# Patient Record
Sex: Male | Born: 1965 | Hispanic: Yes | Marital: Married | Smoking: Current every day smoker
Health system: Southern US, Community
[De-identification: ages and names within clinical notes are randomized; demographics above are authoritative.]

---

## 2005-06-14 ENCOUNTER — Emergency Department: Payer: Self-pay | Admitting: Emergency Medicine

## 2011-06-14 ENCOUNTER — Emergency Department: Payer: Self-pay | Admitting: Emergency Medicine

## 2012-09-11 ENCOUNTER — Emergency Department: Payer: Self-pay | Admitting: Emergency Medicine

## 2014-06-12 ENCOUNTER — Encounter: Payer: Self-pay | Admitting: Emergency Medicine

## 2014-06-12 ENCOUNTER — Emergency Department
Admission: EM | Admit: 2014-06-12 | Discharge: 2014-06-12 | Disposition: A | Discharge status: Home / Self Care | Payer: Self-pay | Attending: Emergency Medicine | Admitting: Emergency Medicine

## 2014-06-12 ENCOUNTER — Emergency Department: Payer: Self-pay

## 2014-06-12 DIAGNOSIS — Y998 Other external cause status: Secondary | ICD-10-CM | POA: Insufficient documentation

## 2014-06-12 DIAGNOSIS — Y92410 Unspecified street and highway as the place of occurrence of the external cause: Secondary | ICD-10-CM | POA: Insufficient documentation

## 2014-06-12 DIAGNOSIS — S99921A Unspecified injury of right foot, initial encounter: Secondary | ICD-10-CM | POA: Insufficient documentation

## 2014-06-12 DIAGNOSIS — S63619A Unspecified sprain of unspecified finger, initial encounter: Secondary | ICD-10-CM

## 2014-06-12 DIAGNOSIS — S63612A Unspecified sprain of right middle finger, initial encounter: Secondary | ICD-10-CM | POA: Insufficient documentation

## 2014-06-12 DIAGNOSIS — Z720 Tobacco use: Secondary | ICD-10-CM | POA: Insufficient documentation

## 2014-06-12 DIAGNOSIS — Y9389 Activity, other specified: Secondary | ICD-10-CM | POA: Insufficient documentation

## 2014-06-12 DIAGNOSIS — Z79899 Other long term (current) drug therapy: Secondary | ICD-10-CM | POA: Insufficient documentation

## 2014-06-12 DIAGNOSIS — L089 Local infection of the skin and subcutaneous tissue, unspecified: Secondary | ICD-10-CM | POA: Insufficient documentation

## 2014-06-12 DIAGNOSIS — B9689 Other specified bacterial agents as the cause of diseases classified elsewhere: Secondary | ICD-10-CM

## 2014-06-12 LAB — BASIC METABOLIC PANEL
Anion gap: 5 (ref 5–15)
BUN: 12 mg/dL (ref 6–20)
CO2: 30 mmol/L (ref 22–32)
Calcium: 8.6 mg/dL — ABNORMAL LOW (ref 8.9–10.3)
Chloride: 105 mmol/L (ref 101–111)
Creatinine, Ser: 0.62 mg/dL (ref 0.61–1.24)
GFR calc Af Amer: >60 mL/min (ref >60)
Glucose, Bld: 87 mg/dL (ref 65–99)
POTASSIUM: 3.6 mmol/L (ref 3.5–5.1)
Sodium: 140 mmol/L (ref 135–145)

## 2014-06-12 LAB — CBC WITH DIFFERENTIAL/PLATELET
BASOS PCT: 0 %
Basophils Absolute: 0 10*3/uL (ref 0–0.1)
Eosinophils Absolute: 0.2 10*3/uL (ref 0–0.7)
Eosinophils Relative: 2 %
HCT: 36.9 % — ABNORMAL LOW (ref 40.0–52.0)
Hemoglobin: 12.5 g/dL — ABNORMAL LOW (ref 13.0–18.0)
LYMPHS PCT: 50 %
Lymphs Abs: 4.6 10*3/uL — ABNORMAL HIGH (ref 1.0–3.6)
MCH: 36.6 pg — ABNORMAL HIGH (ref 26.0–34.0)
MCHC: 34 g/dL (ref 32.0–36.0)
MCV: 107.8 fL — ABNORMAL HIGH (ref 80.0–100.0)
Monocytes Absolute: 0.5 10*3/uL (ref 0.2–1.0)
Monocytes Relative: 5 %
Neutro Abs: 4 10*3/uL (ref 1.4–6.5)
Neutrophils Relative %: 43 %
PLATELETS: 457 10*3/uL — AB (ref 150–440)
RBC: 3.43 MIL/uL — ABNORMAL LOW (ref 4.40–5.90)
RDW: 17.7 % — AB (ref 11.5–14.5)
WBC: 9.3 10*3/uL (ref 3.8–10.6)

## 2014-06-12 MED ORDER — IBUPROFEN 800 MG PO TABS: 800.0000 mg | ORAL_TABLET | Freq: Three times a day (TID) | ORAL | Status: AC | PRN

## 2014-06-12 MED ORDER — TRAMADOL HCL 50 MG PO TABS
ORAL_TABLET | ORAL | Status: AC
  Filled 2014-06-12: qty 1

## 2014-06-12 MED ORDER — SULFAMETHOXAZOLE-TRIMETHOPRIM 800-160 MG PO TABS: 1.0000 | ORAL_TABLET | Freq: Two times a day (BID) | ORAL | Status: DC

## 2014-06-12 MED ORDER — IBUPROFEN 800 MG PO TABS
ORAL_TABLET | ORAL | Status: AC
  Filled 2014-06-12: qty 1

## 2014-06-12 MED ORDER — IBUPROFEN 800 MG PO TABS
800.0000 mg | ORAL_TABLET | Freq: Once | ORAL | Status: AC
  Administered 2014-06-12: 800 mg via ORAL

## 2014-06-12 MED ORDER — SULFAMETHOXAZOLE-TRIMETHOPRIM 800-160 MG PO TABS
1.0000 | ORAL_TABLET | Freq: Once | ORAL | Status: AC
  Administered 2014-06-12: 1 via ORAL

## 2014-06-12 MED ORDER — TRAMADOL HCL 50 MG PO TABS
50.0000 mg | ORAL_TABLET | Freq: Once | ORAL | Status: AC
  Administered 2014-06-12: 50 mg via ORAL

## 2014-06-12 MED ORDER — SULFAMETHOXAZOLE-TRIMETHOPRIM 800-160 MG PO TABS
ORAL_TABLET | ORAL | Status: AC
  Filled 2014-06-12: qty 1

## 2014-06-12 MED ORDER — TRAMADOL HCL 50 MG PO TABS: 50.0000 mg | ORAL_TABLET | Freq: Four times a day (QID) | ORAL | Status: AC | PRN
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
## 2014-06-12 NOTE — Discharge Instructions (Signed)
Esguince de dedo  °(Finger Sprain) ° Un esguince de dedo es un desgarro en uno de los tejidos fuertes y fibrosos (ligamentos) que conectan los huesos en el dedo. La gravedad del esguince depende de la cantidad de ligamento que se rompa. La ruptura puede ser parcial o completa.  °CAUSAS  °A menudo, los esguinces son el resultado de una caída o de un accidente. Si extiende las manos para tomar un objeto o para protegerse, la fuerza del impacto hace que las fibras del ligamento se estiren demasiado. Este exceso de tensión es la causa de que las fibras del ligamento se rompan.  °SÍNTOMAS  °Es posible que pierda el movimiento del dedo. Otros síntomas son:  °· Hematomas °· Sensibilidad. °· Hinchazón. °DIAGNÓSTICO  °Con el fin de diagnosticar el esguince de dedo, el médico examinará el dedo para determinar el grado de desgarro del ligamento. El médico también puede indicar una radiografía del dedo para asegurarse de que no hay huesos rotos.  °TRATAMIENTO  °Si el ligamento está sólo parcialmente roto, el tratamiento generalmente consiste en mantenerlo en una posición fija (immovilización) durante un corto período. Para ello, el médico aplicará un vendaje, yeso, o férula para impedir que el dedo se mueva hasta que se cure. Para un ligamento parcialmente roto, el proceso de curación por lo general demora de 2 a 3 semanas.  °Si el ligamento está completamente roto, podrá necesitar una cirugía para volver a unir el ligamento al hueso. Después de la cirugía le colocarán un yeso o una férula y tendrá que dejar el dedo inmóvil durante 4 a 6 semanas, mientras que el ligamento se cura.  °INSTRUCCIONES PARA EL CUIDADO EN EL HOGAR  °· Mantenga el dedo afectado elevado cuando le sea posible, para disminuir la hinchazón. °· Para aliviar el dolor y la hinchazón, aplique hielo en la articulación dos veces por día, durante 2 a 3 días: °¨ Ponga el hielo en una bolsa plástica. °¨ Colóquese una toalla entre la piel y la bolsa de  hielo. °¨ Deje el hielo en el lugar durante 15 minutos. °· Tome sólo medicamentos de venta libre o recetados para calmar el dolor, según las indicaciones del médico. °· No use anillos en el dedo lesionado. °· No deje su dedo sin protección hasta que el dolor y la rigidez desaparezcan (generalmente entre 3 a 4 semanas). °· No deje que el yeso o la férula se mojen. Cúbralos con una bolsa plástica cuando se dé un baño o una ducha. No debe practicar natación. °· El médico podrá indicarle ejercicios especiales para que haga durante la recuperación para evitar o limitar la rigidez permanente. °SOLICITE ATENCIÓN MÉDICA DE INMEDIATO SI:  °· El yeso o la férula se dañan. °· El dolor empeora en lugar de mejorar. °ASEGÚRESE DE QUE:  °· Comprende estas instrucciones. °· Controlará su enfermedad. °· Solicitará ayuda de inmediato si no mejora o si empeora. °Document Released: 01/05/2005 Document Revised: 03/30/2011 °Exit[employer]® Patient Information ©2015 [employer]. This information is not intended to replace advice given to you by your health care provider. Make sure you discuss any questions you have with your health care provider. ° °
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
## 2018-01-14 ENCOUNTER — Emergency Department
Admission: EM | Admit: 2018-01-14 | Discharge: 2018-01-14 | Disposition: A | Discharge status: Home / Self Care | Payer: Self-pay | Attending: Emergency Medicine | Admitting: Emergency Medicine

## 2018-01-14 ENCOUNTER — Other Ambulatory Visit: Payer: Self-pay

## 2018-01-14 DIAGNOSIS — F17200 Nicotine dependence, unspecified, uncomplicated: Secondary | ICD-10-CM | POA: Insufficient documentation

## 2018-01-14 DIAGNOSIS — B354 Tinea corporis: Secondary | ICD-10-CM | POA: Insufficient documentation

## 2018-01-14 DIAGNOSIS — H1013 Acute atopic conjunctivitis, bilateral: Secondary | ICD-10-CM | POA: Insufficient documentation

## 2018-01-14 MED ORDER — NYSTATIN 100000 UNIT/GM EX POWD: Freq: Four times a day (QID) | CUTANEOUS | 0 refills | Status: AC

## 2018-01-14 MED ORDER — NYSTATIN 100000 UNIT/GM EX CREA: 1.0000 "application " | TOPICAL_CREAM | Freq: Every day | CUTANEOUS | 0 refills | Status: AC

## 2018-01-14 MED ORDER — HYDROXYZINE HCL 50 MG PO TABS: 50.0000 mg | ORAL_TABLET | Freq: Three times a day (TID) | ORAL | 0 refills | Status: AC | PRN

## 2018-01-14 NOTE — ED Notes (Signed)
See triage note  Presents with rash to lower abd and moving into groin and into both thighs  Started about 2 months ago   Positive itching

## 2018-01-14 NOTE — ED Provider Notes (Signed)
Billy Stewart   ____________________________________________   First MD Initiated Contact with Patient 01/14/18 1002     (approximate)  I have reviewed the triage vital signs and the nursing notes.   HISTORY Via interpreter Chief Complaint Rash    HPI Billy Stewart is a 52 y.o. male patient complain of rash on the lower abdomen and perineal area for 2 weeks.  Patient state occasionally he gets the same type of rash in his scalp.  Patient also complained of itchy and redness of the left eye.  Patient described with discomfort as "itchy".  No palliative measures for complaint.  No past medical history on file.  There are no active problems to display for this patient.   No past surgical history on file.  Prior to Admission medications   Medication Sig Start Date End Date Taking? Authorizing Provider  hydrOXYzine (ATARAX/VISTARIL) 50 MG tablet Take 1 tablet (50 mg total) by mouth 3 (three) times daily as needed. 01/14/18   Joni ReiningSmith, Ronald K, PA-C  ibuprofen (ADVIL,MOTRIN) 800 MG tablet Take 1 tablet (800 mg total) by mouth every 8 (eight) hours as needed for moderate pain. 06/12/14   Joni ReiningSmith, Ronald K, PA-C  nystatin (MYCOSTATIN/NYSTOP) powder Apply topically 4 (four) times daily. 01/14/18   Joni ReiningSmith, Ronald K, PA-C  nystatin cream (MYCOSTATIN) Apply 1 application topically at bedtime. 01/14/18   Joni ReiningSmith, Ronald K, PA-C  sulfamethoxazole-trimethoprim (BACTRIM DS,SEPTRA DS) 800-160 MG per tablet Take 1 tablet by mouth 2 (two) times daily. 06/12/14   Joni ReiningSmith, Ronald K, PA-C  traMADol (ULTRAM) 50 MG tablet Take 1 tablet (50 mg total) by mouth every 6 (six) hours as needed for moderate pain. 06/12/14   Joni ReiningSmith, Ronald K, PA-C    Allergies Patient has no known allergies.  No family history on file.  Social History Social History   Tobacco Use  . Smoking status: Current Every Day Smoker  Substance Use Topics  . Alcohol use:  No  . Drug use: Not on file    Review of Systems Constitutional: No fever/chills Eyes: No visual changes.  Redness left eye ENT: No sore throat. Cardiovascular: Denies chest pain. Respiratory: Denies shortness of breath. Gastrointestinal: No abdominal pain.  No nausea, no vomiting.  No diarrhea.  No constipation. Genitourinary: Negative for dysuria. Musculoskeletal: Negative for back pain. Skin: Positive for rash. Neurological: Negative for headaches, focal weakness or numbness.   ____________________________________________   PHYSICAL EXAM:  VITAL SIGNS: ED Triage Vitals  Enc Vitals Group     BP 01/14/18 0940 (!) 110/93     Pulse Rate 01/14/18 0940 66     Resp 01/14/18 0940 18     Temp 01/14/18 0940 (!) 97.5 F (36.4 C)     Temp Source 01/14/18 0940 Oral     SpO2 01/14/18 0940 97 %     Weight 01/14/18 0941 204 lb (92.5 kg)     Height 01/14/18 0941 6' (1.829 m)     Head Circumference --      Peak Flow --      Pain Score 01/14/18 0940 10     Pain Loc --      Pain Edu? --      Excl. in GC? --     Constitutional: Alert and oriented. Well appearing and in no acute distress. Eyes: Left conjunctiva is erythematous. PERRL. EOMI. Cardiovascular: Normal rate, regular rhythm. Grossly normal heart sounds.  Good peripheral circulation. Respiratory: Normal respiratory effort.  No retractions.  Lungs CTAB. Skin:  Skin is warm, dry and intact.  Scaly rash lower abdomen and perineal area.  No signs of secondary infection.   Psychiatric: Mood and affect are normal. Speech and behavior are normal.  ____________________________________________   LABS (all labs ordered are listed, but only abnormal results are displayed)  Labs Reviewed - No data to display ____________________________________________  EKG   ____________________________________________  RADIOLOGY  ED MD interpretation:    Official radiology report(s): No results  found.  ____________________________________________   PROCEDURES  Procedure(s) performed: None  Procedures  Critical Care performed: No  ____________________________________________   INITIAL IMPRESSION / ASSESSMENT AND PLAN / ED COURSE  As part of my medical decision making, I reviewed the following data within the electronic MEDICAL RECORD NUMBER    Patient presents for itchy rash lower abdomen and perineal area.  Physical findings consistent with tinea corporis.  Patient also presents with erythema and watery eyes and consistent with allergic conjunctivitis.  Patient given discharge care instructions advised to use medications as directed.  [facility]     ____________________________________________   FINAL CLINICAL IMPRESSION(S) / ED DIAGNOSES  Final diagnoses:  Tinea corporis  Allergic conjunctivitis of both eyes     ED Discharge Orders         Ordered    nystatin cream (MYCOSTATIN)  Daily at bedtime     01/14/18 1014    nystatin (MYCOSTATIN/NYSTOP) powder  4 times daily     01/14/18 1014    hydrOXYzine (ATARAX/VISTARIL) 50 MG tablet  3 times daily PRN     01/14/18 1014           Stewart:  This document was prepared using Dragon voice recognition software and may include unintentional dictation errors.    Joni ReiningSmith, Ronald K, PA-C 01/14/18 1038    Emily FilbertWilliams, Jonathan E, MD 01/14/18 [phone]

## 2018-01-14 NOTE — ED Triage Notes (Signed)
Pt states through the interpreter that he has a rash in his perineal area that is spreading. Denies ever a rash in his perineal but states sometimes he gets it in his scalp, pt also c/o left eye redness

## 2021-10-25 ENCOUNTER — Other Ambulatory Visit: Payer: Self-pay

## 2021-10-25 ENCOUNTER — Emergency Department
Admission: EM | Admit: 2021-10-25 | Discharge: 2021-10-25 | Disposition: A | Discharge status: Home / Self Care | Payer: Self-pay | Attending: Emergency Medicine | Admitting: Emergency Medicine

## 2021-10-25 DIAGNOSIS — R109 Unspecified abdominal pain: Secondary | ICD-10-CM | POA: Insufficient documentation

## 2021-10-25 DIAGNOSIS — R739 Hyperglycemia, unspecified: Secondary | ICD-10-CM | POA: Insufficient documentation

## 2021-10-25 DIAGNOSIS — R7401 Elevation of levels of liver transaminase levels: Secondary | ICD-10-CM | POA: Insufficient documentation

## 2021-10-25 LAB — COMPREHENSIVE METABOLIC PANEL
ALT: 93 U/L — ABNORMAL HIGH (ref 0–44)
AST: 65 U/L — ABNORMAL HIGH (ref 15–41)
Albumin: 4.2 g/dL (ref 3.5–5.0)
Alkaline Phosphatase: 109 U/L (ref 38–126)
Anion gap: 6 (ref 5–15)
BUN: 13 mg/dL (ref 6–20)
CO2: 26 mmol/L (ref 22–32)
Calcium: 9.1 mg/dL (ref 8.9–10.3)
Chloride: 106 mmol/L (ref 98–111)
Creatinine, Ser: 0.73 mg/dL (ref 0.61–1.24)
GFR, Estimated: >60 mL/min (ref >60)
Glucose, Bld: 290 mg/dL — ABNORMAL HIGH (ref 70–99)
Potassium: 3.8 mmol/L (ref 3.5–5.1)
Sodium: 138 mmol/L (ref 135–145)
Total Bilirubin: 0.9 mg/dL (ref 0.3–1.2)
Total Protein: 7.9 g/dL (ref 6.5–8.1)

## 2021-10-25 LAB — BLOOD GAS, VENOUS
Acid-Base Excess: 1.5 mmol/L (ref 0.0–2.0)
Bicarbonate: 26.6 mmol/L (ref 20.0–28.0)
O2 Saturation: 85 %
Patient temperature: 37
pCO2, Ven: 43 mmHg — ABNORMAL LOW (ref 44–60)
pH, Ven: 7.4 (ref 7.25–7.43)
pO2, Ven: 54 mmHg — ABNORMAL HIGH (ref 32–45)

## 2021-10-25 LAB — URINALYSIS, ROUTINE W REFLEX MICROSCOPIC
Bilirubin Urine: NEGATIVE
Glucose, UA: >=500 mg/dL — AB
Hgb urine dipstick: NEGATIVE
Ketones, ur: 80 mg/dL — AB
Leukocytes,Ua: NEGATIVE
Nitrite: NEGATIVE
Protein, ur: 30 mg/dL — AB
Specific Gravity, Urine: 1.043 — ABNORMAL HIGH (ref 1.005–1.030)
pH: 5 (ref 5.0–8.0)

## 2021-10-25 LAB — CBC
HCT: 49.1 % (ref 39.0–52.0)
Hemoglobin: 16.6 g/dL (ref 13.0–17.0)
MCH: 29.4 pg (ref 26.0–34.0)
MCHC: 33.8 g/dL (ref 30.0–36.0)
MCV: 86.9 fL (ref 80.0–100.0)
Platelets: 303 10*3/uL (ref 150–400)
RBC: 5.65 MIL/uL (ref 4.22–5.81)
RDW: 12.6 % (ref 11.5–15.5)
WBC: 6.5 10*3/uL (ref 4.0–10.5)
nRBC: 0 % (ref 0.0–0.2)

## 2021-10-25 LAB — CBG MONITORING, ED
Glucose-Capillary: 210 mg/dL — ABNORMAL HIGH (ref 70–99)
Glucose-Capillary: 219 mg/dL — ABNORMAL HIGH (ref 70–99)

## 2021-10-25 LAB — LIPASE, BLOOD: Lipase: 35 U/L (ref 11–51)

## 2021-10-25 MED ORDER — SODIUM CHLORIDE 0.9 % IV BOLUS
500.0000 mL | Freq: Once | INTRAVENOUS | Status: AC
  Administered 2021-10-25: 500 mL via INTRAVENOUS

## 2021-10-25 MED ORDER — METFORMIN HCL 500 MG PO TABS: 500.0000 mg | ORAL_TABLET | Freq: Two times a day (BID) | ORAL | 1 refills | Status: AC

## 2021-10-25 MED ORDER — SODIUM CHLORIDE 0.9 % IV BOLUS
1000.0000 mL | Freq: Once | INTRAVENOUS | Status: AC
  Administered 2021-10-25: 1000 mL via INTRAVENOUS

## 2021-10-25 NOTE — ED Provider Notes (Signed)
[facility] Provider Note    Event Date/Time   First MD Initiated Contact with Patient 10/25/21 1128     (approximate)   History   Urinary Frequency   HPI  HPI obtained via in person Spanish interpreter  Billy Stewart is a 56 y.o. male no active medical problems who presents with increased thirst and polyuria over the last 2 weeks.  He denies associated generalized weakness or lightheadedness.  He does have some intermittent right-sided flank pain especially associated with urination although he is not having any pain currently.  He denies any upper abdominal pain.  He has no fever or chills.  He is not currently on any medications and has no history of diabetes.  I reviewed the past medical records.  The patient's most recent ED visit was in 2019 for a rash.  He has no recent admissions and there are no outpatient records.   Physical Exam   Triage Vital Signs: ED Triage Vitals [10/25/21 1040]  Enc Vitals Group     BP (!) 144/95     Pulse Rate (!) 58     Resp 20     Temp 97.7 F (36.5 C)     Temp Source Oral     SpO2 95 %     Weight 205 lb 0.4 oz (93 kg)     Height 6' (1.829 m)     Head Circumference      Peak Flow      Pain Score 5     Pain Loc      Pain Edu?      Excl. in [place]?     Most recent vital signs: Vitals:   10/25/21 1342 10/25/21 1454  BP: 113/84 111/77  Pulse: (!) 50 (!) 49  Resp: 17 17  Temp:    SpO2: 97% 99%     General: Alert and oriented, well-appearing. CV:  Good peripheral perfusion.  Resp:  Normal effort.  Abd:  Soft and nontender.  No distention.  Other:  Moist mucous membranes.   ED Results / Procedures / Treatments   Labs (all labs ordered are listed, but only abnormal results are displayed) Labs Reviewed  COMPREHENSIVE METABOLIC PANEL - Abnormal; Notable for the following components:      Result Value   Glucose, Bld 290 (*)    AST 65 (*)    ALT 93 (*)    All other components within normal  limits  URINALYSIS, ROUTINE W REFLEX MICROSCOPIC - Abnormal; Notable for the following components:   Color, Urine YELLOW (*)    APPearance HAZY (*)    Specific Gravity, Urine 1.043 (*)    Glucose, UA >=500 (*)    Ketones, ur 80 (*)    Protein, ur 30 (*)    Bacteria, UA RARE (*)    All other components within normal limits  BLOOD GAS, VENOUS - Abnormal; Notable for the following components:   pCO2, Ven 43 (*)    pO2, Ven 54 (*)    All other components within normal limits  CBG MONITORING, ED - Abnormal; Notable for the following components:   Glucose-Capillary 219 (*)    All other components within normal limits  CBG MONITORING, ED - Abnormal; Notable for the following components:   Glucose-Capillary 210 (*)    All other components within normal limits  LIPASE, BLOOD  CBC     EKG     RADIOLOGY     PROCEDURES:  Critical Care performed:  No  Procedures   MEDICATIONS ORDERED IN ED: Medications  sodium chloride 0.9 % bolus 1,000 mL (0 mLs Intravenous Stopped 10/25/21 1341)  sodium chloride 0.9 % bolus 500 mL (0 mLs Intravenous Stopped 10/25/21 1454)     IMPRESSION / MDM / ASSESSMENT AND PLAN / ED COURSE  I reviewed the triage vital signs and the nursing notes.  56 year old male with no active medical problems presents with polydipsia and polyuria for the last 2 weeks.  He reports some mild intermittent abdominal pain but it is not present currently and he is not tender on exam.  Urinalysis shows ketones and significant glucose.  Differential diagnosis includes, but is not limited to, new onset diabetes, hyperglycemia, DKA.  Patient's presentation is most consistent with acute complicated illness / injury requiring diagnostic workup.  We will obtain basic labs, VBG, and reassess.  If the patient is hyperglycemic he will need IV fluids.  ----------------------------------------- 3:28 PM on 10/25/2021 -----------------------------------------  Lab work-up is  significant for glucose of 290 but with no anion gap and a normal pH on the VBG.  This is consistent with new onset diabetes and simple hyperglycemia with no evidence of DKA.  LFTs are minimally elevated but this does not appear to be clinically significant.  The patient was given 1.5 L of fluid and the glucose went down to 200.  I did consider whether he may benefit from admission especially as he currently does not have a PMD, however given that the glucose is not severely elevated, the patient is well-appearing, tolerating p.o., and minimally symptomatic at this time, he is appropriate for outpatient management.  He would like to go home.  I prescribed metformin and have given the patient referral to 2 area outpatient primary care clinics.  I counseled the patient and his daughter via the Spanish interpreter and answered all of their questions.  I gave them strict return precautions and they expressed understanding.   FINAL CLINICAL IMPRESSION(S) / ED DIAGNOSES   Final diagnoses:  Hyperglycemia     Rx / DC Orders   ED Discharge Orders          Ordered    metFORMIN (GLUCOPHAGE) 500 MG tablet  2 times daily with meals        10/25/21 1429             Note:  This document was prepared using Dragon voice recognition software and may include unintentional dictation errors.    Dionne Bucy, MD 10/25/21 1530

## 2021-10-25 NOTE — ED Notes (Signed)
CBG 210 

## 2021-10-25 NOTE — Discharge Instructions (Addendum)
Make an appointment to follow-up with an outpatient clinic within the next few weeks.  Return to the ER for any new or worsening symptoms that concern you.

## 2021-10-25 NOTE — ED Triage Notes (Signed)
Per interpreter, pt has been drinking a lot of water and urinating a lot for the past week. Pt also reports some abd pain. Pt also with pain to his right upper quadrant intermittently for the past 2 weeks.

## 2023-10-28 ENCOUNTER — Emergency Department
Admission: EM | Admit: 2023-10-28 | Discharge: 2023-10-28 | Disposition: A | Discharge status: Home / Self Care | Payer: Self-pay

## 2023-10-28 ENCOUNTER — Encounter: Payer: Self-pay | Admitting: Emergency Medicine

## 2023-10-28 DIAGNOSIS — S0006XA Insect bite (nonvenomous) of scalp, initial encounter: Secondary | ICD-10-CM | POA: Insufficient documentation

## 2023-10-28 DIAGNOSIS — W57XXXA Bitten or stung by nonvenomous insect and other nonvenomous arthropods, initial encounter: Secondary | ICD-10-CM | POA: Insufficient documentation

## 2023-10-28 MED ORDER — PREDNISONE 50 MG PO TABS: ORAL_TABLET | ORAL | 0 refills | Status: AC

## 2023-10-28 MED ORDER — DOXYCYCLINE HYCLATE 100 MG PO TABS: 100.0000 mg | ORAL_TABLET | Freq: Two times a day (BID) | ORAL | 0 refills | Status: AC

## 2023-10-28 MED ORDER — DOXYCYCLINE HYCLATE 100 MG PO TABS
100.0000 mg | ORAL_TABLET | Freq: Once | ORAL | Status: AC
  Administered 2023-10-28: 100 mg via ORAL
  Filled 2023-10-28: qty 1

## 2023-10-28 MED ORDER — PREDNISONE 20 MG PO TABS
60.0000 mg | ORAL_TABLET | Freq: Once | ORAL | Status: AC
  Administered 2023-10-28: 60 mg via ORAL
  Filled 2023-10-28: qty 3

## 2023-10-28 NOTE — ED Triage Notes (Signed)
 Pt reports forehead pain yesterday. Woke up today with facial swelling. Denies pain, just states face is very itchy. No swelling to tongue, lips or throat.

## 2023-10-28 NOTE — ED Provider Notes (Signed)
 [facility] Provider Note    Event Date/Time   First MD Initiated Contact with Patient 10/28/23 1731     (approximate)   History   No chief complaint on file.    HPI  Billy Stewart is a 58 y.o. male    with a past medical history of hyperglycemia, tinea corporis,  who presents to the ED complaining of facial swelling. According to the patient, he was working yesterday outdoors.  Today he woke up with frontal edema, scalp itchiness. The edema is coming down to his eyes.  Patient denies shortness of breath, wheezing, difficulty swallowing, chest pain, abdominal pain, fever.  Patient is here with his daughter.      There are no active problems to display for this patient.    ROS: Patient currently denies any vision changes, tinnitus, difficulty speaking, facial droop, sore throat, chest pain, shortness of breath, abdominal pain, nausea/vomiting/diarrhea, dysuria, or weakness/numbness/paresthesias in any extremity   Physical Exam   Triage Vital Signs: ED Triage Vitals [10/28/23 1714]  Encounter Vitals Group     BP 122/78     Girls Systolic BP Percentile      Girls Diastolic BP Percentile      Boys Systolic BP Percentile      Boys Diastolic BP Percentile      Pulse Rate (!) 57     Resp 16     Temp 98.9 F (37.2 C)     Temp Source Oral     SpO2 97 %     Weight      Height      Head Circumference      Peak Flow      Pain Score 0     Pain Loc      Pain Education      Exclude from Growth Chart     Most recent vital signs: Vitals:   10/28/23 1714  BP: 122/78  Pulse: (!) 57  Resp: 16  Temp: 98.9 F (37.2 C)  SpO2: 97%     Physical Exam Vitals and nursing note reviewed.  During triage patient was bradycardic  General:          Awake, no distress.  CV:                  Good peripheral perfusion.  Resp:               Normal effort. no tachypnea, no wheezing Abd:                 No distention.  Soft nontender Other:               Scalp: Presence of punctuated  lesion at the frontal area with mild suppuration.  Scalp is erythematous.  Evidence of edema in the frontal area, mild erythema.  Edema is covering the nasal bridge and nasal angle of the eyes.  ED Results / Procedures / Treatments   Labs (all labs ordered are listed, but only abnormal results are displayed) Labs Reviewed - No data to display     RADIOLOGY I independently reviewed and interpreted imaging and agree with radiologists findings.      PROCEDURES:  Critical Care performed:   Procedures   MEDICATIONS ORDERED IN ED: Medications  predniSONE (DELTASONE) tablet 60 mg (60 mg Oral Given 10/28/23 1750)  doxycycline (VIBRA-TABS) tablet 100 mg (100 mg Oral Given 10/28/23 1801)   Clinical Course as of 10/28/23 1818  Thu  Oct 28, 2023  1815 Reassessed the patient, endorses feeling better after getting prednisone and doxycycline.  Patient is going to be discharged with prescription for prednisone and doxycycline.  Patient is agreeable with the plan [AE]    Clinical Course User Index [AE] Janit Kast, PA-C    IMPRESSION / MDM / ASSESSMENT AND PLAN / ED COURSE  I reviewed the triage vital signs and the nursing notes.  Differential diagnosis includes, but is not limited to, allergic reaction to insect bite, cellulitis, abscess.   Patient's presentation is most consistent with acute, uncomplicated illness.   Billy Stewart is a 58 y.o., male who presents today with history of frontal edema, associated frontal scalp pruritus in the last 12 hours.  Patient was working outdoors yesterday.  On a physical exam, scalp is erythematous, mild edema, with left punctuated active lesion with mild suppuration of serous fluid.  Erythematous forehead,  edema.  Edema extends to the nasal bridge and the angular side of the eyes.  Rest of physical exam is normal. Plan Prednisone Doxycycline Reassess Patient's diagnosis is consistent with insect bite  and cellulitis. I did not order any imaging or labs, physical exam was reassuring I did review the patient's allergies and medications.The patient is in stable and satisfactory condition for discharge home  Patient will be discharged home with prescriptions for prednisone, doxycycline. Patient is to follow up with PCP as needed or otherwise directed.  I did advise the patient to take the antibiotic for 7 days. Patient is given ED precautions to return to the ED for any worsening or new symptoms.  Patient does not have insurance I did provide good Rx card to buy the prescription. Discussed plan of care with patient, answered all of patient's questions, Patient agreeable to plan of care. Advised patient to take medications according to the instructions on the label. Discussed possible side effects of new medications. Patient verbalized understanding.  FINAL CLINICAL IMPRESSION(S) / ED DIAGNOSES   Final diagnoses:  Insect bite of scalp, initial encounter     Rx / DC Orders   ED Discharge Orders          Ordered    doxycycline (VIBRA-TABS) 100 MG tablet  2 times daily        10/28/23 1818    predniSONE (DELTASONE) 50 MG tablet        10/28/23 1818             Note:  This document was prepared using Dragon voice recognition software and may include unintentional dictation errors.   Janit Kast, PA-C 10/28/23 1818    Clarine Ozell LABOR, MD 10/29/23 [phone]

## 2023-10-28 NOTE — Discharge Instructions (Signed)
 You have been diagnosed with insect bite infection.  Please take prednisone 1 tablet with breakfast every day for 4 days.  Please take doxycycline 1 capsule by mouth every 12 hours after main meals.  Please come back to ED or go to your PCP if you have new symptoms or symptoms worsen.
